# Patient Record
Sex: Female | Born: 1975 | Race: White | Hispanic: No | Marital: Married | Smoking: Never smoker
Health system: Southern US, Community
[De-identification: ages and names within clinical notes are randomized; demographics above are authoritative.]

## PROBLEM LIST (undated history)

## (undated) DIAGNOSIS — E249 Cushing's syndrome, unspecified: Secondary | ICD-10-CM

## (undated) DIAGNOSIS — C439 Malignant melanoma of skin, unspecified: Secondary | ICD-10-CM

## (undated) HISTORY — DX: Malignant melanoma of skin, unspecified: C43.9

## (undated) HISTORY — DX: Cushing's syndrome, unspecified: E24.9

---

## 1996-09-05 HISTORY — PX: ADRENAL GLAND SURGERY: SHX544

## 1999-11-26 ENCOUNTER — Other Ambulatory Visit: Admission: RE | Admit: 1999-11-26 | Discharge: 1999-11-26 | Payer: Self-pay | Admitting: Internal Medicine

## 2001-01-10 ENCOUNTER — Other Ambulatory Visit: Admission: RE | Admit: 2001-01-10 | Discharge: 2001-01-10 | Payer: Self-pay | Admitting: Obstetrics & Gynecology

## 2002-01-25 ENCOUNTER — Other Ambulatory Visit: Admission: RE | Admit: 2002-01-25 | Discharge: 2002-01-25 | Payer: Self-pay | Admitting: Obstetrics & Gynecology

## 2003-02-17 ENCOUNTER — Other Ambulatory Visit: Admission: RE | Admit: 2003-02-17 | Discharge: 2003-02-17 | Payer: Self-pay | Admitting: Obstetrics & Gynecology

## 2004-06-01 ENCOUNTER — Emergency Department (HOSPITAL_COMMUNITY): Admission: EM | Admit: 2004-06-01 | Discharge: 2004-06-02 | Payer: Self-pay | Admitting: Emergency Medicine

## 2006-10-06 ENCOUNTER — Other Ambulatory Visit: Admission: RE | Admit: 2006-10-06 | Discharge: 2006-10-06 | Payer: Self-pay | Admitting: Family Medicine

## 2010-02-17 ENCOUNTER — Other Ambulatory Visit: Admission: RE | Admit: 2010-02-17 | Discharge: 2010-02-17 | Payer: Self-pay | Admitting: Family Medicine

## 2010-09-14 ENCOUNTER — Inpatient Hospital Stay (HOSPITAL_COMMUNITY)
Admission: AD | Admit: 2010-09-14 | Discharge: 2010-09-14 | Payer: Self-pay | Source: Home / Self Care | Attending: Obstetrics and Gynecology | Admitting: Obstetrics and Gynecology

## 2010-10-13 ENCOUNTER — Encounter: Payer: BC Managed Care – PPO | Attending: Obstetrics and Gynecology

## 2010-12-23 ENCOUNTER — Inpatient Hospital Stay (HOSPITAL_COMMUNITY)
Admission: RE | Admit: 2010-12-23 | Discharge: 2010-12-25 | DRG: 372 | Disposition: A | Discharge status: Home / Self Care | Payer: BC Managed Care – PPO | Source: Ambulatory Visit | Attending: Obstetrics and Gynecology | Admitting: Obstetrics and Gynecology

## 2010-12-23 DIAGNOSIS — O99814 Abnormal glucose complicating childbirth: Secondary | ICD-10-CM | POA: Diagnosis present

## 2010-12-23 LAB — CBC
HCT: 38 % (ref 36.0–46.0)
Hemoglobin: 12.9 g/dL (ref 12.0–15.0)
MCH: 30.9 pg (ref 26.0–34.0)
MCHC: 33.9 g/dL (ref 30.0–36.0)
MCV: 90.9 fL (ref 78.0–100.0)
RBC: 4.18 MIL/uL (ref 3.87–5.11)

## 2010-12-23 LAB — GLUCOSE, RANDOM: Glucose, Bld: 126 mg/dL — ABNORMAL HIGH (ref 70–99)

## 2010-12-24 LAB — CBC
HCT: 33.6 % — ABNORMAL LOW (ref 36.0–46.0)
Hemoglobin: 11.1 g/dL — ABNORMAL LOW (ref 12.0–15.0)
MCH: 30.2 pg (ref 26.0–34.0)
MCV: 91.3 fL (ref 78.0–100.0)
Platelets: 108 10*3/uL — ABNORMAL LOW (ref 150–400)
RBC: 3.68 MIL/uL — ABNORMAL LOW (ref 3.87–5.11)
WBC: 13 10*3/uL — ABNORMAL HIGH (ref 4.0–10.5)

## 2011-01-07 NOTE — Discharge Summary (Signed)
Felicia Simon, Simon                ACCOUNT NO.:  [id number]  MEDICAL RECORD NO.:  [id number]           PATIENT TYPE:  I  LOCATION:  9124                          FACILITY:  [facility]  PHYSICIAN:  Malachi Pro. Ambrose Mantle, M.D. DATE OF BIRTH:  21-Oct-1975  DATE OF ADMISSION:  12/23/2010 DATE OF DISCHARGE:  12/24/2010                              DISCHARGE SUMMARY   This 35 year old white female, para 0 gravida 1, EDC December 20, 2010, by last period, presented for induction of labor.  Prenatal care was significant for gestational diabetes, well controlled on diet.  No other issues.  Prenatal labs, O+ with a negative antibody, HIV negative, rubella immune, RPR nonreactive, hepatitis B surface antigen negative, GC and Chlamydia negative.  She declined genetic screening, 1-hour Glucola was 162, three-hour GTT showed diabetes, group B strep was negative.  PAST OBSTETRICAL HISTORY:  Negative.  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  No known allergies.  Prenatal vitamins.  SOCIAL HISTORY:  No ethyl alcohol, tobacco, or drugs.  She is married.  FAMILY HISTORY:  Father with high blood pressure.  Maternal grandmother with ovarian cancer.  Grandfather with Parkinson disease.  PHYSICAL EXAMINATION:  VITAL SIGNS:  On admission, she was afebrile with normal vital signs. HEART:  Normal. LUNGS:  Normal. ABDOMEN:  Soft, gravid. PELVIC:  Cervix was 2-3 cm, 50%, rupture of membranes produced clear fluid.  The patient received an epidural by 1:40 p.m.  She was 5 cm, 70%.  An intrauterine pressure catheter was placed.  She progressed to complete dilatation and a +2 station, was noted to have increasing variable decelerations with Dr. Senaida Ores present.  The patient pushed for approximately 40 minutes and brought the vertex down to the outlet.  At this point, fetal heart rate had  sharp and prolonged decelerations in the 80s, so she and her partner were counseled regarding the risks and agreed to vacuum-assisted delivery.  The Kiwi vacuum was applied to the outlet position LOA and brought to crowning with one easy pull, no pop offs, vacuum remained in the green zone.  The patient then pushed out the remainder of the head and the body of a viable female.  Apgars of 8 and 9 at 1 and 5 minutes, weight 7 pounds 6 ounces.  Placenta was delivered spontaneously, second-degree laceration repaired with 3-0 Vicryl.  Sphincter reinforced with 2-0 but not torn.  Cervix and rectum were intact.  Blood loss about 400 mL.  Postpartum, the patient did well and was discharged on the second postpartum day.  Initial hemoglobin 12.9, hematocrit 38, white count 10,900, platelet count 121,000, RPR nonreactive.  Glucose test was 126.  Followup hemoglobin 11.1, platelet count of 108,000.  FINAL DIAGNOSES:  Intrauterine pregnancy at 40+ weeks, delivered left occipitoanterior (fetal position), terminal fetal heart rate decelerations.  OPERATION:  Vacuum-assisted vaginal delivery, LOA, and second-degree midline laceration and repair.  FINAL CONDITION:  Improved.  INSTRUCTIONS:  Our regular discharge instruction booklet.  The patient is advised to return to the office  in 6 weeks for followup examination. Motrin 600 mg 30 tablets one every 6 hours as needed for pain is given at discharge.  Since the patient had gestational diabetes, she is advised that check for glucose tolerance should be done at 6-12 weeks postpartum.     Malachi Pro. Ambrose Mantle, M.D.     TFH/MEDQ  D:  12/25/2010  T:  12/25/2010  Job:  [id number]  Electronically Signed by Tracey Harries M.D. on 01/07/2011 09:11:58 AM

## 2012-04-27 ENCOUNTER — Other Ambulatory Visit (HOSPITAL_COMMUNITY)
Admission: RE | Admit: 2012-04-27 | Discharge: 2012-04-27 | Disposition: A | Discharge status: Home / Self Care | Payer: BC Managed Care – PPO | Source: Ambulatory Visit | Attending: Family Medicine | Admitting: Family Medicine

## 2012-04-27 ENCOUNTER — Other Ambulatory Visit: Payer: Self-pay | Admitting: Family Medicine

## 2012-04-27 DIAGNOSIS — Z1151 Encounter for screening for human papillomavirus (HPV): Secondary | ICD-10-CM | POA: Insufficient documentation

## 2012-04-27 DIAGNOSIS — Z01419 Encounter for gynecological examination (general) (routine) without abnormal findings: Secondary | ICD-10-CM | POA: Insufficient documentation

## 2012-09-05 NOTE — L&D Delivery Note (Signed)
Delivery Note I was called and told pt had progressed very quickly to complete dilation.  On my arrival she was sitting for an epidural and wanted to proceed with that.  I sttod by why she received her epidural and she did get some immediate relief although she still felt she needed to push.  She pushed well and brought the vertex to a +3 station with baby direct OP.  The FHR was dropping with pushing and did not fully recover between contractions the last several pushes.  The patient was counseled for a vacuum assisted delivery to expedite things and was agreeable.  A kiwi vacuum was applied and in the green zone to +3 station.  The vertex was brought to crowning and pop-off occurred.  The pt was then able to finish pushing out the vertex ini one more contraction.At 11:39 AM a viable female was delivered via Vaginal, Vacuum (Extractor) (Presentation: ; Occiput Posterior).  APGAR:8 , 9; weight pending .   Placenta status: Intact, Spontaneous.  Cord: 3 vessels with the following complications: None.    Anesthesia: Epidural  Episiotomy: None Lacerations: 2nd degree;Perineal Suture Repair: 3.0 vicryl rapide Est. Blood Loss (mL): 300cc  Mom to postpartum.  Baby to Couplet care / Skin to Skin.  Felicia Simon 07/23/2013, 12:02 PM

## 2013-01-08 LAB — OB RESULTS CONSOLE GC/CHLAMYDIA: Chlamydia: NEGATIVE

## 2013-01-08 LAB — OB RESULTS CONSOLE HIV ANTIBODY (ROUTINE TESTING): HIV: NONREACTIVE

## 2013-01-08 LAB — OB RESULTS CONSOLE ABO/RH: RH Type: POSITIVE

## 2013-01-08 LAB — OB RESULTS CONSOLE RPR: RPR: NONREACTIVE

## 2013-01-08 LAB — OB RESULTS CONSOLE HEPATITIS B SURFACE ANTIGEN: Hepatitis B Surface Ag: NEGATIVE

## 2013-07-17 ENCOUNTER — Telehealth (HOSPITAL_COMMUNITY): Payer: Self-pay | Admitting: *Deleted

## 2013-07-17 ENCOUNTER — Encounter (HOSPITAL_COMMUNITY): Payer: Self-pay | Admitting: *Deleted

## 2013-07-17 NOTE — Telephone Encounter (Signed)
Preadmission screen  

## 2013-07-21 NOTE — H&P (Signed)
Felicia Simon is a 37 y.o. female G2P1001 at 39+ weeks (EDD 07/26/13 by 7 week [place])  presenting for OL at term.  Prenatal care complicated by gestational diabetes well-controlled on diet.  No other significant issues.    Maternal Medical History:  Contractions: Frequency: rare.   Perceived severity is mild.    Fetal activity: Perceived fetal activity is normal.    Prenatal Complications - Diabetes: gestational. Diabetes is managed by diet.      Past OB Hx 2012 Vacuum 7#6oz  Past Medical History  Diagnosis Date  . Melanoma     on back  . Cushing's syndrome    Past Surgical History  Procedure Laterality Date  . Adrenal gland surgery  1998   Family History: family history is not on file. Social History:  has no tobacco, alcohol, and drug history on file.   Prenatal Transfer Tool  Maternal Diabetes: Yes:  Diabetes Type:  Diet controlled Genetic Screening: Normal Maternal Ultrasounds/Referrals: Normal Fetal Ultrasounds or other Referrals:  None Maternal Substance Abuse:  No Significant Maternal Medications:  None Significant Maternal Lab Results:  None Other Comments:  None  ROS    There were no vitals taken for this visit. Maternal Exam:  Uterine Assessment: Contraction strength is mild.  Contraction frequency is rare.   Abdomen: Patient reports no abdominal tenderness. Fetal presentation: vertex  Introitus: Normal vulva. Normal vagina.  Pelvis: adequate for delivery.      Physical Exam  Constitutional: She is oriented to person, place, and time. She appears well-developed and well-nourished.  Cardiovascular: Normal rate and regular rhythm.   Respiratory: Effort normal and breath sounds normal.  GI: Soft.  Genitourinary: Vagina normal and uterus normal.  Gravid  Neurological: She is alert and oriented to person, place, and time.  Psychiatric: She has a normal mood and affect.    Prenatal labs: ABO, Rh: O/Positive/-- (05/06 0000) Antibody: Negative (05/06  0000) Rubella: Immune (05/06 0000) RPR: Nonreactive (05/06 0000)  HBsAg: Negative (05/06 0000)  HIV: Non-reactive (05/06 0000)  GBS: Negative (10/24 0000)  Early one hour glucola WNL, 28 week screen abnormal at 144 Quad screen WNL  Assessment/Plan: Pt admitted for IOL at term with favorable cervix.  Plan AROM and pitocin.  BS have been well-controlled on diet.   Oliver Pila 07/21/2013, 9:45 AM

## 2013-07-23 ENCOUNTER — Inpatient Hospital Stay (HOSPITAL_COMMUNITY)
Admission: RE | Admit: 2013-07-23 | Discharge: 2013-07-25 | DRG: 775 | Disposition: A | Discharge status: Home / Self Care | Payer: BC Managed Care – PPO | Source: Ambulatory Visit | Attending: Obstetrics and Gynecology | Admitting: Obstetrics and Gynecology

## 2013-07-23 ENCOUNTER — Encounter (HOSPITAL_COMMUNITY): Payer: Self-pay

## 2013-07-23 ENCOUNTER — Inpatient Hospital Stay (HOSPITAL_COMMUNITY): Payer: BC Managed Care – PPO | Admitting: Anesthesiology

## 2013-07-23 ENCOUNTER — Encounter (HOSPITAL_COMMUNITY): Payer: BC Managed Care – PPO | Admitting: Anesthesiology

## 2013-07-23 DIAGNOSIS — O99814 Abnormal glucose complicating childbirth: Principal | ICD-10-CM | POA: Diagnosis present

## 2013-07-23 LAB — CBC
Hemoglobin: 12.6 g/dL (ref 12.0–15.0)
MCH: 30 pg (ref 26.0–34.0)
Platelets: 124 10*3/uL — ABNORMAL LOW (ref 150–400)
RBC: 4.2 MIL/uL (ref 3.87–5.11)
WBC: 8.3 10*3/uL (ref 4.0–10.5)

## 2013-07-23 LAB — GLUCOSE, CAPILLARY: Glucose-Capillary: 75 mg/dL (ref 70–99)

## 2013-07-23 LAB — RPR: RPR Ser Ql: NONREACTIVE

## 2013-07-23 MED ORDER — DIBUCAINE 1 % RE OINT: 1.0000 "application " | TOPICAL_OINTMENT | RECTAL | Status: DC | PRN

## 2013-07-23 MED ORDER — DIPHENHYDRAMINE HCL 50 MG/ML IJ SOLN: 12.5000 mg | INTRAMUSCULAR | Status: DC | PRN

## 2013-07-23 MED ORDER — SODIUM BICARBONATE 8.4 % IV SOLN
INTRAVENOUS | Status: DC | PRN
  Administered 2013-07-23: 4 mL via EPIDURAL
  Administered 2013-07-23 (×2): 3 mL via EPIDURAL

## 2013-07-23 MED ORDER — PHENYLEPHRINE 40 MCG/ML (10ML) SYRINGE FOR IV PUSH (FOR BLOOD PRESSURE SUPPORT)
80.0000 ug | PREFILLED_SYRINGE | INTRAVENOUS | Status: DC | PRN
  Filled 2013-07-23: qty 2

## 2013-07-23 MED ORDER — LACTATED RINGERS IV SOLN: 500.0000 mL | INTRAVENOUS | Status: DC | PRN

## 2013-07-23 MED ORDER — OXYTOCIN BOLUS FROM INFUSION
500.0000 mL | INTRAVENOUS | Status: DC
  Administered 2013-07-23: 500 mL via INTRAVENOUS

## 2013-07-23 MED ORDER — EPHEDRINE 5 MG/ML INJ
10.0000 mg | INTRAVENOUS | Status: DC | PRN
  Filled 2013-07-23: qty 2
  Filled 2013-07-23: qty 4

## 2013-07-23 MED ORDER — PHENYLEPHRINE 40 MCG/ML (10ML) SYRINGE FOR IV PUSH (FOR BLOOD PRESSURE SUPPORT)
80.0000 ug | PREFILLED_SYRINGE | INTRAVENOUS | Status: DC | PRN
  Filled 2013-07-23: qty 10
  Filled 2013-07-23: qty 2

## 2013-07-23 MED ORDER — LANOLIN HYDROUS EX OINT: TOPICAL_OINTMENT | CUTANEOUS | Status: DC | PRN

## 2013-07-23 MED ORDER — ONDANSETRON HCL 4 MG/2ML IJ SOLN: 4.0000 mg | Freq: Four times a day (QID) | INTRAMUSCULAR | Status: DC | PRN

## 2013-07-23 MED ORDER — OXYCODONE-ACETAMINOPHEN 5-325 MG PO TABS: 1.0000 | ORAL_TABLET | ORAL | Status: DC | PRN

## 2013-07-23 MED ORDER — WITCH HAZEL-GLYCERIN EX PADS: 1.0000 "application " | MEDICATED_PAD | CUTANEOUS | Status: DC | PRN

## 2013-07-23 MED ORDER — TETANUS-DIPHTH-ACELL PERTUSSIS 5-2.5-18.5 LF-MCG/0.5 IM SUSP: 0.5000 mL | Freq: Once | INTRAMUSCULAR | Status: DC

## 2013-07-23 MED ORDER — EPHEDRINE 5 MG/ML INJ
10.0000 mg | INTRAVENOUS | Status: DC | PRN
  Filled 2013-07-23: qty 2

## 2013-07-23 MED ORDER — TERBUTALINE SULFATE 1 MG/ML IJ SOLN: 0.2500 mg | Freq: Once | INTRAMUSCULAR | Status: DC | PRN

## 2013-07-23 MED ORDER — BUTORPHANOL TARTRATE 1 MG/ML IJ SOLN
1.0000 mg | INTRAMUSCULAR | Status: DC | PRN
  Administered 2013-07-23: 1 mg via INTRAVENOUS
  Filled 2013-07-23: qty 1

## 2013-07-23 MED ORDER — IBUPROFEN 600 MG PO TABS
600.0000 mg | ORAL_TABLET | Freq: Four times a day (QID) | ORAL | Status: DC
  Administered 2013-07-23 – 2013-07-25 (×9): 600 mg via ORAL
  Filled 2013-07-23 (×9): qty 1

## 2013-07-23 MED ORDER — ACETAMINOPHEN 325 MG PO TABS: 650.0000 mg | ORAL_TABLET | ORAL | Status: DC | PRN

## 2013-07-23 MED ORDER — FENTANYL CITRATE 0.05 MG/ML IJ SOLN
INTRAMUSCULAR | Status: AC
  Filled 2013-07-23: qty 2

## 2013-07-23 MED ORDER — FENTANYL CITRATE 0.05 MG/ML IJ SOLN: 100.0000 ug | Freq: Once | INTRAMUSCULAR | Status: DC

## 2013-07-23 MED ORDER — IBUPROFEN 600 MG PO TABS
600.0000 mg | ORAL_TABLET | Freq: Four times a day (QID) | ORAL | Status: DC | PRN
  Administered 2013-07-23: 600 mg via ORAL
  Filled 2013-07-23: qty 1

## 2013-07-23 MED ORDER — SENNOSIDES-DOCUSATE SODIUM 8.6-50 MG PO TABS
2.0000 | ORAL_TABLET | ORAL | Status: DC
  Administered 2013-07-23: 2 via ORAL
  Filled 2013-07-23 (×2): qty 2

## 2013-07-23 MED ORDER — OXYTOCIN 40 UNITS IN LACTATED RINGERS INFUSION - SIMPLE MED
62.5000 mL/h | INTRAVENOUS | Status: DC
  Administered 2013-07-23: 62.5 mL/h via INTRAVENOUS
  Filled 2013-07-23: qty 1000

## 2013-07-23 MED ORDER — LACTATED RINGERS IV SOLN
INTRAVENOUS | Status: DC
  Administered 2013-07-23: 08:00:00 via INTRAVENOUS

## 2013-07-23 MED ORDER — FENTANYL CITRATE 0.05 MG/ML IJ SOLN
INTRAMUSCULAR | Status: AC
  Administered 2013-07-23: 100 ug
  Filled 2013-07-23: qty 2

## 2013-07-23 MED ORDER — LACTATED RINGERS IV SOLN: 500.0000 mL | Freq: Once | INTRAVENOUS | Status: DC

## 2013-07-23 MED ORDER — CITRIC ACID-SODIUM CITRATE 334-500 MG/5ML PO SOLN: 30.0000 mL | ORAL | Status: DC | PRN

## 2013-07-23 MED ORDER — SIMETHICONE 80 MG PO CHEW: 80.0000 mg | CHEWABLE_TABLET | ORAL | Status: DC | PRN

## 2013-07-23 MED ORDER — DIPHENHYDRAMINE HCL 25 MG PO CAPS: 25.0000 mg | ORAL_CAPSULE | Freq: Four times a day (QID) | ORAL | Status: DC | PRN

## 2013-07-23 MED ORDER — ONDANSETRON HCL 4 MG/2ML IJ SOLN: 4.0000 mg | INTRAMUSCULAR | Status: DC | PRN

## 2013-07-23 MED ORDER — BENZOCAINE-MENTHOL 20-0.5 % EX AERO
1.0000 "application " | INHALATION_SPRAY | CUTANEOUS | Status: DC | PRN
  Administered 2013-07-23: 1 via TOPICAL
  Filled 2013-07-23: qty 56

## 2013-07-23 MED ORDER — ONDANSETRON HCL 4 MG PO TABS: 4.0000 mg | ORAL_TABLET | ORAL | Status: DC | PRN

## 2013-07-23 MED ORDER — FENTANYL 2.5 MCG/ML BUPIVACAINE 1/10 % EPIDURAL INFUSION (WH - ANES)
14.0000 mL/h | INTRAMUSCULAR | Status: DC | PRN
  Filled 2013-07-23: qty 125

## 2013-07-23 MED ORDER — LIDOCAINE HCL (PF) 1 % IJ SOLN
30.0000 mL | INTRAMUSCULAR | Status: DC | PRN
  Filled 2013-07-23 (×2): qty 30

## 2013-07-23 MED ORDER — PRENATAL MULTIVITAMIN CH
1.0000 | ORAL_TABLET | Freq: Every day | ORAL | Status: DC
  Administered 2013-07-24 – 2013-07-25 (×2): 1 via ORAL
  Filled 2013-07-23 (×2): qty 1

## 2013-07-23 MED ORDER — ZOLPIDEM TARTRATE 5 MG PO TABS: 5.0000 mg | ORAL_TABLET | Freq: Every evening | ORAL | Status: DC | PRN

## 2013-07-23 MED ORDER — OXYTOCIN 40 UNITS IN LACTATED RINGERS INFUSION - SIMPLE MED
1.0000 m[IU]/min | INTRAVENOUS | Status: DC
  Administered 2013-07-23: 2 m[IU]/min via INTRAVENOUS
  Filled 2013-07-23: qty 1000

## 2013-07-23 NOTE — Lactation Note (Signed)
This note was copied from the chart of Felicia Simon. Lactation Consultation Note  Patient Name: Felicia Ressie Slevin [id number] Date: 07/23/2013 Reason for consult: Follow-up assessment Mom trying to sleep, reports baby is BF better and she reports if he will not latch she is doing STS, hand expression and spoon feeding. Blood sugar has been stable with 3 consecutive CBG's greater than 45. Advised Mom to call for assist as needed. LC to follow up with Mom tomorrow.   Maternal Data    Feeding Feeding Type: Breast Fed Length of feed: 8 min  LATCH Score/Interventions Latch: Grasps breast easily, tongue down, lips flanged, rhythmical sucking. Intervention(s): Skin to skin;Teach feeding cues Intervention(s): Adjust position;Assist with latch;Breast compression  Audible Swallowing: A few with stimulation Intervention(s): Skin to skin;Hand expression  Type of Nipple: Everted at rest and after stimulation Intervention(s): No intervention needed  Comfort (Breast/Nipple): Soft / non-tender     Hold (Positioning): Assistance needed to correctly position infant at breast and maintain latch. Intervention(s): Breastfeeding basics reviewed;Position options;Support Pillows  LATCH Score: 8  Lactation Tools Discussed/Used     Consult Status Consult Status: Follow-up Date: 07/24/13 Follow-up type: In-patient    Alfred Levins 07/23/2013, 9:29 PM

## 2013-07-23 NOTE — Anesthesia Preprocedure Evaluation (Signed)
Anesthesia Evaluation  Patient identified by MRN, date of birth, ID band Patient awake    Reviewed: Allergy & Precautions, H&P , NPO status , Patient's Chart, lab work & pertinent test results  Airway Mallampati: II TM Distance: >3 FB Neck ROM: full    Dental  (+) Teeth Intact   Pulmonary neg pulmonary ROS,  breath sounds clear to auscultation  Pulmonary exam normal       Cardiovascular negative cardio ROS  Rhythm:regular Rate:Normal     Neuro/Psych negative neurological ROS  negative psych ROS   GI/Hepatic negative GI ROS, Neg liver ROS,   Endo/Other  Cushing syndrome  Renal/GU negative Renal ROS     Musculoskeletal   Abdominal Normal abdominal exam  (+)   Peds  Hematology   Anesthesia Other Findings   Reproductive/Obstetrics (+) Pregnancy                           Anesthesia Physical Anesthesia Plan  ASA: II  Anesthesia Plan: Epidural   Post-op Pain Management:    Induction:   Airway Management Planned:   Additional Equipment:   Intra-op Plan:   Post-operative Plan:   Informed Consent: I have reviewed the patients History and Physical, chart, labs and discussed the procedure including the risks, benefits and alternatives for the proposed anesthesia with the patient or authorized representative who has indicated his/her understanding and acceptance.     Plan Discussed with: Anesthesiologist  Anesthesia Plan Comments:         Anesthesia Quick Evaluation

## 2013-07-23 NOTE — Anesthesia Procedure Notes (Signed)
Epidural Patient location during procedure: OB  Staffing Anesthesiologist: Heather Roberts Performed by: anesthesiologist   Preanesthetic Checklist Completed: patient identified, site marked, surgical consent, pre-op evaluation, timeout performed, IV checked, risks and benefits discussed, monitors and equipment checked and post-op pain management  Epidural Patient position: sitting Prep: DuraPrep Patient monitoring: heart rate, cardiac monitor, continuous pulse ox and blood pressure Approach: midline Injection technique: LOR air  Needle:  Needle type: Tuohy  Needle gauge: 17 G Needle length: 9 cm Needle insertion depth: 7 cm Catheter type: closed end flexible Catheter size: 19 Gauge Catheter at skin depth: 12 cm Test dose: negative and 2% lidocaine with Epi 1:200 K  Assessment Events: blood not aspirated, injection not painful and no injection resistance  Additional Notes Reason for block:post-op pain management

## 2013-07-23 NOTE — Progress Notes (Signed)
Patient ID: Felicia Simon, female   DOB: 06-15-1976, 37 y.o.   MRN: [id number] Pt not feeling ctx yet afeb vss FHR category 1 70/3/-2 AROM clear  On pitocin, will follow progress. Epidural prn.

## 2013-07-23 NOTE — Progress Notes (Signed)
Assisting with patient care at this time. IVF bolus for epidural in progress. Anesth Dr. Krista Blue notified of pt request for epidural. Support and encouragement given.

## 2013-07-24 LAB — CBC
HCT: 32.7 % — ABNORMAL LOW (ref 36.0–46.0)
Hemoglobin: 11.4 g/dL — ABNORMAL LOW (ref 12.0–15.0)
MCHC: 34.9 g/dL (ref 30.0–36.0)
MCV: 87.9 fL (ref 78.0–100.0)
Platelets: 112 10*3/uL — ABNORMAL LOW (ref 150–400)
RDW: 13.4 % (ref 11.5–15.5)
WBC: 10.6 10*3/uL — ABNORMAL HIGH (ref 4.0–10.5)

## 2013-07-24 NOTE — Progress Notes (Signed)
Post Partum Day 1 Subjective: no complaints and tolerating PO  Objective: Blood pressure 116/79, pulse 78, temperature 97.7 F (36.5 C), temperature source Oral, resp. rate 18, height 5\' 6"  (1.676 m), weight 84.369 kg (186 lb), SpO2 100.00%, unknown if currently breastfeeding.  Physical Exam:  General: alert and cooperative Lochia: appropriate Uterine Fundus: firm    Recent Labs  07/23/13 0720 07/24/13 0550  HGB 12.6 11.4*  HCT 36.6 32.7*    Assessment/Plan: Plan for discharge tomorrow   LOS: 1 day   Demetra Moya W 07/24/2013, 9:09 AM

## 2013-07-24 NOTE — Discharge Summary (Signed)
Obstetric Discharge Summary Reason for Admission: induction of labor Prenatal Procedures: none Intrapartum Procedures: vacuum Postpartum Procedures: none Complications-Operative and Postpartum: second degree perineal laceration Hemoglobin  Date Value Range Status  07/24/2013 11.4* 12.0 - 15.0 g/dL Final     HCT  Date Value Range Status  07/24/2013 32.7* 36.0 - 46.0 % Final    Physical Exam:  General: alert and cooperative Lochia: appropriate Uterine Fundus: firm   Discharge Diagnoses: Term Pregnancy-delivered  Discharge Information: Date: 07/25/2013 Activity: pelvic rest Diet: routine Medications: Ibuprofen Condition: improved Instructions: refer to practice specific booklet Discharge to: home Follow-up Information   Follow up with Oliver Pila, MD. Schedule an appointment as soon as possible for a visit in 6 weeks. (postpartum)    Specialty:  Obstetrics and Gynecology   Contact information:   [address], [address] [place] [address] [phone]       Newborn Data: Live born female  Birth Weight: 7 lb 4.8 oz (3310 g) APGAR: 8, 9  Home with mother.  Oliver Pila 07/25/2013, 7:41 AM

## 2013-07-25 MED ORDER — IBUPROFEN 600 MG PO TABS: 600.0000 mg | ORAL_TABLET | Freq: Four times a day (QID) | ORAL | Status: DC

## 2013-07-25 NOTE — Lactation Note (Signed)
This note was copied from the chart of Felicia Simon. Lactation Consultation Note  Patient Name: Felicia Railynn Ballo [id number] Date: 07/25/2013 Reason for consult: Follow-up assessment;Difficult latch Mom called for LC to observe latch. Assisted Mom with obtaining more depth with the latch using the nipple shield. Breast milk present when baby at the breast and in the nipple shield at the end of the feeding. Encouraged Mom to keep baby actively nursing for 15-30 minutes on 1st breast, then offer other breast if baby still hungry. Mom has DEBP at home and will post pump during the day to encourage milk production. Will call if decides to schedule OP follow up.   Maternal Data    Feeding Feeding Type: Breast Fed Length of feed: 15 min  LATCH Score/Interventions Latch: Grasps breast easily, tongue down, lips flanged, rhythmical sucking. (using nipple shield #20) Intervention(s): Adjust position  Audible Swallowing: Spontaneous and intermittent  Type of Nipple: Everted at rest and after stimulation  Comfort (Breast/Nipple): Soft / non-tender     Hold (Positioning): Assistance needed to correctly position infant at breast and maintain latch. Intervention(s): Breastfeeding basics reviewed;Support Pillows;Position options;Skin to skin  LATCH Score: 9  Lactation Tools Discussed/Used Tools: Nipple Dorris Carnes;Pump Nipple shield size: 20 Breast pump type: Manual   Consult Status Consult Status: Complete Date: 07/25/13 Follow-up type: In-patient    Alfred Levins 07/25/2013, 9:50 AM

## 2013-07-25 NOTE — Progress Notes (Signed)
Post Partum Day 2 Subjective: no complaints and up ad lib  Objective: Blood pressure 115/74, pulse 73, temperature 98.3 F (36.8 C), temperature source Oral, resp. rate 19, height 5\' 6"  (1.676 m), weight 84.369 kg (186 lb), SpO2 99.00%, unknown if currently breastfeeding.  Physical Exam:  General: alert and cooperative Lochia: appropriate Uterine Fundus: firm    Recent Labs  07/23/13 0720 07/24/13 0550  HGB 12.6 11.4*  HCT 36.6 32.7*    Assessment/Plan: Discharge home Motrin   LOS: 2 days   Margarite Vessel W 07/25/2013, 6:11 AM

## 2013-07-25 NOTE — Lactation Note (Signed)
This note was copied from the chart of Felicia Chaka Boyson. Lactation Consultation Note  Patient Name: Felicia Simon [id number] Date: 07/25/2013  Mom reports baby is nursing well with nipple shield. She reports observing colostrum in the nipple shield at the end of the feeding. Mom reports her nipple pain has improved. Engorgement care reviewed if needed. Advised Mom baby should be at the breast 8-12 times in 24 hours or more. Monitor voids/stools. Encouraged to schedule OP follow up due to nipple shield use. Mom advised she will call. Encouraged Mom to call for LC to observe latch before d/c.   Maternal Data    Feeding Feeding Type: Breast Fed Length of feed: 15 min  LATCH Score/Interventions                      Lactation Tools Discussed/Used     Consult Status      Alfred Levins 07/25/2013, 8:58 AM

## 2013-07-30 NOTE — Anesthesia Postprocedure Evaluation (Signed)
  Anesthesia Post-op Note  Patient: Felicia Simon  Procedure(s) Performed: Epidural  Patient Location: PACU and Mother/Baby  Anesthesia Type:Epidural  Level of Consciousness: awake  Airway and Oxygen Therapy: Patient Spontanous Breathing  Post-op Pain: mild  Post-op Assessment: Post-op Vital signs reviewed  Post-op Vital Signs: Reviewed  Complications: No apparent anesthesia complications Completed via chart review only

## 2013-07-30 NOTE — Addendum Note (Signed)
Addendum created 07/30/13 0810 by Heather Roberts, MD   Modules edited: Notes Section   Notes Section:  File: [id number]

## 2013-08-26 ENCOUNTER — Other Ambulatory Visit: Payer: Self-pay | Admitting: Dermatology

## 2014-07-07 ENCOUNTER — Encounter (HOSPITAL_COMMUNITY): Payer: Self-pay

## 2018-09-05 HISTORY — PX: LIPOMA EXCISION: SHX5283

## 2021-01-19 ENCOUNTER — Other Ambulatory Visit: Payer: Self-pay | Admitting: Obstetrics and Gynecology

## 2021-01-19 DIAGNOSIS — R928 Other abnormal and inconclusive findings on diagnostic imaging of breast: Secondary | ICD-10-CM

## 2021-02-15 ENCOUNTER — Other Ambulatory Visit: Payer: Self-pay

## 2021-03-03 ENCOUNTER — Ambulatory Visit
Admission: RE | Admit: 2021-03-03 | Discharge: 2021-03-03 | Disposition: A | Discharge status: Home / Self Care | Payer: BC Managed Care – PPO | Source: Ambulatory Visit | Attending: Obstetrics and Gynecology | Admitting: Obstetrics and Gynecology

## 2021-03-03 ENCOUNTER — Other Ambulatory Visit: Payer: Self-pay | Admitting: Obstetrics and Gynecology

## 2021-03-03 ENCOUNTER — Other Ambulatory Visit: Payer: Self-pay

## 2021-03-03 DIAGNOSIS — R928 Other abnormal and inconclusive findings on diagnostic imaging of breast: Secondary | ICD-10-CM

## 2021-03-03 DIAGNOSIS — N6489 Other specified disorders of breast: Secondary | ICD-10-CM

## 2021-03-03 DIAGNOSIS — N6310 Unspecified lump in the right breast, unspecified quadrant: Secondary | ICD-10-CM

## 2021-03-16 ENCOUNTER — Ambulatory Visit
Admission: RE | Admit: 2021-03-16 | Discharge: 2021-03-16 | Disposition: A | Discharge status: Home / Self Care | Payer: BC Managed Care – PPO | Source: Ambulatory Visit | Attending: Obstetrics and Gynecology | Admitting: Obstetrics and Gynecology

## 2021-03-16 ENCOUNTER — Other Ambulatory Visit: Payer: Self-pay

## 2021-03-16 DIAGNOSIS — N6310 Unspecified lump in the right breast, unspecified quadrant: Secondary | ICD-10-CM

## 2021-03-16 DIAGNOSIS — N6489 Other specified disorders of breast: Secondary | ICD-10-CM

## 2021-05-14 ENCOUNTER — Ambulatory Visit: Payer: Self-pay | Admitting: Surgery

## 2021-05-14 DIAGNOSIS — N6489 Other specified disorders of breast: Secondary | ICD-10-CM

## 2021-05-20 ENCOUNTER — Other Ambulatory Visit: Payer: Self-pay | Admitting: Surgery

## 2021-05-20 DIAGNOSIS — Z1231 Encounter for screening mammogram for malignant neoplasm of breast: Secondary | ICD-10-CM

## 2021-06-08 NOTE — Pre-Procedure Instructions (Signed)
Surgical Instructions    Your procedure is scheduled on Thursday 06/17/21.   Report to [facility] Main Entrance "A" at 05:30 A.M., then check in with the Admitting office.  Call this number if you have problems the morning of surgery:  [phone]   If you have any questions prior to your surgery date call [phone]: Open Monday-Friday 8am-4pm    Remember:  Do not eat after midnight the night before your surgery  You may drink clear liquids until 04:30 A.M. the morning of your surgery.   Clear liquids allowed are: Water, Non-Citrus Juices (without pulp), Carbonated Beverages, Clear Tea, Black Coffee ONLY (NO MILK, CREAM OR POWDERED CREAMER of any kind), and Gatorade    Take these medicines the morning of surgery with A SIP OF WATER   NONE   As of today, STOP taking any Aspirin (unless otherwise instructed by your surgeon) Aleve, Naproxen, Ibuprofen, Motrin, Advil, Goody's, BC's, all herbal medications, fish oil, and all vitamins.          Do not wear jewelry or makeup Do not wear lotions, powders, perfumes/colognes, or deodorant. Do not shave 48 hours prior to surgery.  Men may shave face and neck. Do not bring valuables to the hospital. DO Not wear nail polish, gel polish, artificial nails, or any other type of covering on natural nails including finger and toenails. If patients have artificial nails, gel coating, etc. that need to be removed by a nail salon please have this removed prior to surgery or surgery may need to be canceled/delayed if the surgeon/ anesthesia feels like the patient is unable to be adequately monitored.             [facility] is not responsible for any belongings or valuables.  Do NOT Smoke (Tobacco/Vaping)  24 hours prior to your procedure If you use a CPAP at night, you may bring your mask for your overnight stay.   Contacts, glasses, dentures or bridgework may not be worn into surgery, please bring cases for these belongings   For patients  admitted to the hospital, discharge time will be determined by your treatment team.   Patients discharged the day of surgery will not be allowed to drive home, and someone needs to stay with them for 24 hours.  NO VISITORS WILL BE ALLOWED IN PRE-OP WHERE PATIENTS ARE PREPPED FOR SURGERY.  ONLY 1 SUPPORT PERSON MAY BE PRESENT IN THE WAITING ROOM WHILE YOU ARE IN SURGERY.  IF YOU ARE TO BE ADMITTED, ONCE YOU ARE IN YOUR ROOM YOU WILL BE ALLOWED TWO (2) VISITORS. 1 (ONE) VISITOR MAY STAY OVERNIGHT BUT MUST ARRIVE TO THE ROOM BY 8pm.  Minor children may have two parents present. Special consideration for safety and communication needs will be reviewed on a case by case basis.  Special instructions:    Oral Hygiene is also important to reduce your risk of infection.  Remember - BRUSH YOUR TEETH THE MORNING OF SURGERY WITH YOUR REGULAR TOOTHPASTE   [facility]- Preparing For Surgery  Before surgery, you can play an important role. Because skin is not sterile, your skin needs to be as free of germs as possible. You can reduce the number of germs on your skin by washing with CHG (chlorahexidine gluconate) Soap before surgery.  CHG is an antiseptic cleaner which kills germs and bonds with the skin to continue killing germs even after washing.     Please do not use if you have an allergy to CHG or  antibacterial soaps. If your skin becomes reddened/irritated stop using the CHG.  Do not shave (including legs and underarms) for at least 48 hours prior to first CHG shower. It is OK to shave your face.  Please follow these instructions carefully.     Shower the NIGHT BEFORE SURGERY and the MORNING OF SURGERY with CHG Soap.   If you chose to wash your hair, wash your hair first as usual with your normal shampoo. After you shampoo, rinse your hair and body thoroughly to remove the shampoo.  Then [employer] and genitals (private parts) with your normal soap and rinse thoroughly to remove soap.  After that Use  CHG Soap as you would any other liquid soap. You can apply CHG directly to the skin and wash gently with a scrungie or a clean washcloth.   Apply the CHG Soap to your body ONLY FROM THE NECK DOWN.  Do not use on open wounds or open sores. Avoid contact with your eyes, ears, mouth and genitals (private parts). Wash Face and genitals (private parts)  with your normal soap.   Wash thoroughly, paying special attention to the area where your surgery will be performed.  Thoroughly rinse your body with warm water from the neck down.  DO NOT shower/wash with your normal soap after using and rinsing off the CHG Soap.  Pat yourself dry with a CLEAN TOWEL.  Wear CLEAN PAJAMAS to bed the night before surgery  Place CLEAN SHEETS on your bed the night before your surgery  DO NOT SLEEP WITH PETS.   Day of Surgery:  Take a shower with CHG soap. Wear Clean/Comfortable clothing the morning of surgery Do not apply any deodorants/lotions.   Remember to brush your teeth WITH YOUR REGULAR TOOTHPASTE.   Please read over the following fact sheets that you were given.

## 2021-06-09 ENCOUNTER — Other Ambulatory Visit: Payer: Self-pay

## 2021-06-09 ENCOUNTER — Encounter (HOSPITAL_COMMUNITY)
Admission: RE | Admit: 2021-06-09 | Discharge: 2021-06-09 | Disposition: A | Discharge status: Home / Self Care | Payer: BC Managed Care – PPO | Source: Ambulatory Visit | Attending: Surgery | Admitting: Surgery

## 2021-06-09 ENCOUNTER — Encounter (HOSPITAL_COMMUNITY): Payer: Self-pay

## 2021-06-09 DIAGNOSIS — Z01812 Encounter for preprocedural laboratory examination: Secondary | ICD-10-CM | POA: Diagnosis present

## 2021-06-09 DIAGNOSIS — N6489 Other specified disorders of breast: Secondary | ICD-10-CM | POA: Diagnosis not present

## 2021-06-09 LAB — COMPREHENSIVE METABOLIC PANEL
ALT: 19 U/L (ref 0–44)
AST: 20 U/L (ref 15–41)
Albumin: 3.8 g/dL (ref 3.5–5.0)
Alkaline Phosphatase: 49 U/L (ref 38–126)
Anion gap: 6 (ref 5–15)
BUN: 8 mg/dL (ref 6–20)
CO2: 27 mmol/L (ref 22–32)
Calcium: 8.9 mg/dL (ref 8.9–10.3)
Chloride: 101 mmol/L (ref 98–111)
Creatinine, Ser: 0.69 mg/dL (ref 0.44–1.00)
GFR, Estimated: >60 mL/min (ref >60)
Glucose, Bld: 100 mg/dL — ABNORMAL HIGH (ref 70–99)
Potassium: 3.7 mmol/L (ref 3.5–5.1)
Sodium: 134 mmol/L — ABNORMAL LOW (ref 135–145)
Total Bilirubin: 1 mg/dL (ref 0.3–1.2)
Total Protein: 7.1 g/dL (ref 6.5–8.1)

## 2021-06-09 LAB — CBC WITH DIFFERENTIAL/PLATELET
Abs Immature Granulocytes: 0.02 10*3/uL (ref 0.00–0.07)
Basophils Absolute: 0.1 10*3/uL (ref 0.0–0.1)
Basophils Relative: 1 %
Eosinophils Absolute: 0.1 10*3/uL (ref 0.0–0.5)
Eosinophils Relative: 1 %
HCT: 40.8 % (ref 36.0–46.0)
Hemoglobin: 13 g/dL (ref 12.0–15.0)
Immature Granulocytes: 0 %
Lymphocytes Relative: 32 %
Lymphs Abs: 2.1 10*3/uL (ref 0.7–4.0)
MCH: 27.9 pg (ref 26.0–34.0)
MCHC: 31.9 g/dL (ref 30.0–36.0)
MCV: 87.6 fL (ref 80.0–100.0)
Monocytes Absolute: 0.5 10*3/uL (ref 0.1–1.0)
Monocytes Relative: 8 %
Neutro Abs: 3.9 10*3/uL (ref 1.7–7.7)
Neutrophils Relative %: 58 %
Platelets: 299 10*3/uL (ref 150–400)
RBC: 4.66 MIL/uL (ref 3.87–5.11)
RDW: 12.7 % (ref 11.5–15.5)
WBC: 6.7 10*3/uL (ref 4.0–10.5)
nRBC: 0 % (ref 0.0–0.2)

## 2021-06-09 LAB — POCT PREGNANCY, URINE: Preg Test, Ur: NEGATIVE

## 2021-06-09 NOTE — Progress Notes (Signed)
PCP - LindsayWebster PA with [facility] in [facility]   Chest x-ray - Not indicated EKG - Not indicated  ERAS Protcol - Yes  Anesthesia review: No  Patient denies shortness of breath, fever, cough and chest pain at PAT appointment   All instructions explained to the patient, with a verbal understanding of the material. Patient agrees to go over the instructions while at home for a better understanding.  The opportunity to ask questions was provided.

## 2021-06-16 ENCOUNTER — Ambulatory Visit
Admission: RE | Admit: 2021-06-16 | Discharge: 2021-06-16 | Disposition: A | Discharge status: Home / Self Care | Payer: BC Managed Care – PPO | Source: Ambulatory Visit | Attending: Surgery | Admitting: Surgery

## 2021-06-16 ENCOUNTER — Other Ambulatory Visit: Payer: Self-pay

## 2021-06-16 DIAGNOSIS — N6489 Other specified disorders of breast: Secondary | ICD-10-CM

## 2021-06-16 NOTE — Anesthesia Preprocedure Evaluation (Addendum)
Anesthesia Evaluation  Patient identified by MRN, date of birth, ID band Patient awake    Reviewed: Allergy & Precautions, NPO status , Patient's Chart, lab work & pertinent test results  Airway Mallampati: II  TM Distance: >3 FB Neck ROM: Full    Dental no notable dental hx. (+) Teeth Intact, Dental Advisory Given   Pulmonary neg pulmonary ROS,    Pulmonary exam normal breath sounds clear to auscultation       Cardiovascular negative cardio ROS Normal cardiovascular exam Rhythm:Regular Rate:Normal     Neuro/Psych negative neurological ROS  negative psych ROS   GI/Hepatic negative GI ROS, Neg liver ROS,   Endo/Other  negative endocrine ROSCushing's syndrome s/p adrenal resection  Renal/GU negative Renal ROS  negative genitourinary   Musculoskeletal negative musculoskeletal ROS (+)   Abdominal   Peds  Hematology negative hematology ROS (+)   Anesthesia Other Findings   Reproductive/Obstetrics                            Anesthesia Physical Anesthesia Plan  ASA: 2  Anesthesia Plan: General   Post-op Pain Management:    Induction: Intravenous  PONV Risk Score and Plan: 3 and Ondansetron, Dexamethasone and Midazolam  Airway Management Planned: LMA  Additional Equipment:   Intra-op Plan:   Post-operative Plan: Extubation in OR  Informed Consent: I have reviewed the patients History and Physical, chart, labs and discussed the procedure including the risks, benefits and alternatives for the proposed anesthesia with the patient or authorized representative who has indicated his/her understanding and acceptance.     Dental advisory given  Plan Discussed with: CRNA  Anesthesia Plan Comments:         Anesthesia Quick Evaluation

## 2021-06-17 ENCOUNTER — Ambulatory Visit (HOSPITAL_COMMUNITY): Payer: BC Managed Care – PPO | Admitting: Anesthesiology

## 2021-06-17 ENCOUNTER — Encounter (HOSPITAL_COMMUNITY)
Admission: RE | Disposition: A | Discharge status: Home / Self Care | Payer: Self-pay | Source: Ambulatory Visit | Attending: Surgery

## 2021-06-17 ENCOUNTER — Ambulatory Visit
Admission: RE | Admit: 2021-06-17 | Discharge: 2021-06-17 | Disposition: A | Discharge status: Home / Self Care | Payer: BC Managed Care – PPO | Source: Ambulatory Visit | Attending: Surgery | Admitting: Surgery

## 2021-06-17 ENCOUNTER — Other Ambulatory Visit: Payer: Self-pay

## 2021-06-17 ENCOUNTER — Ambulatory Visit (HOSPITAL_COMMUNITY)
Admission: RE | Admit: 2021-06-17 | Discharge: 2021-06-17 | Disposition: A | Discharge status: Home / Self Care | Payer: BC Managed Care – PPO | Source: Ambulatory Visit | Attending: Surgery | Admitting: Surgery

## 2021-06-17 ENCOUNTER — Encounter (HOSPITAL_COMMUNITY): Payer: Self-pay | Admitting: Surgery

## 2021-06-17 DIAGNOSIS — N6489 Other specified disorders of breast: Secondary | ICD-10-CM | POA: Diagnosis present

## 2021-06-17 DIAGNOSIS — N62 Hypertrophy of breast: Secondary | ICD-10-CM | POA: Insufficient documentation

## 2021-06-17 DIAGNOSIS — Z1231 Encounter for screening mammogram for malignant neoplasm of breast: Secondary | ICD-10-CM

## 2021-06-17 HISTORY — PX: BREAST LUMPECTOMY WITH RADIOACTIVE SEED LOCALIZATION: SHX6424

## 2021-06-17 SURGERY — BREAST LUMPECTOMY WITH RADIOACTIVE SEED LOCALIZATION
Anesthesia: General | Site: Breast | Laterality: Right

## 2021-06-17 MED ORDER — PROPOFOL 10 MG/ML IV BOLUS
INTRAVENOUS | Status: DC | PRN
  Administered 2021-06-17: 200 mg via INTRAVENOUS

## 2021-06-17 MED ORDER — BUPIVACAINE-EPINEPHRINE 0.25% -1:200000 IJ SOLN
INTRAMUSCULAR | Status: DC | PRN
  Administered 2021-06-17: 20 mL

## 2021-06-17 MED ORDER — CHLORHEXIDINE GLUCONATE 0.12 % MT SOLN
15.0000 mL | Freq: Once | OROMUCOSAL | Status: AC
  Administered 2021-06-17: 15 mL via OROMUCOSAL
  Filled 2021-06-17: qty 15

## 2021-06-17 MED ORDER — PROPOFOL 10 MG/ML IV BOLUS
INTRAVENOUS | Status: AC
  Filled 2021-06-17: qty 20

## 2021-06-17 MED ORDER — ACETAMINOPHEN 500 MG PO TABS
1000.0000 mg | ORAL_TABLET | Freq: Once | ORAL | Status: AC
  Administered 2021-06-17: 1000 mg via ORAL
  Filled 2021-06-17: qty 2

## 2021-06-17 MED ORDER — CHLORHEXIDINE GLUCONATE CLOTH 2 % EX PADS: 6.0000 | MEDICATED_PAD | Freq: Once | CUTANEOUS | Status: DC

## 2021-06-17 MED ORDER — LIDOCAINE HCL (CARDIAC) PF 100 MG/5ML IV SOSY
PREFILLED_SYRINGE | INTRAVENOUS | Status: DC | PRN
  Administered 2021-06-17: 40 mg via INTRAVENOUS

## 2021-06-17 MED ORDER — ONDANSETRON HCL 4 MG/2ML IJ SOLN
INTRAMUSCULAR | Status: DC | PRN
  Administered 2021-06-17: 4 mg via INTRAVENOUS

## 2021-06-17 MED ORDER — MIDAZOLAM HCL 5 MG/5ML IJ SOLN
INTRAMUSCULAR | Status: DC | PRN
  Administered 2021-06-17: 2 mg via INTRAVENOUS

## 2021-06-17 MED ORDER — DEXAMETHASONE SODIUM PHOSPHATE 10 MG/ML IJ SOLN
INTRAMUSCULAR | Status: DC | PRN
  Administered 2021-06-17: 8 mg via INTRAVENOUS

## 2021-06-17 MED ORDER — CEFAZOLIN SODIUM-DEXTROSE 2-4 GM/100ML-% IV SOLN
2.0000 g | INTRAVENOUS | Status: AC
  Administered 2021-06-17: 2 g via INTRAVENOUS
  Filled 2021-06-17: qty 100

## 2021-06-17 MED ORDER — HYDROCODONE-ACETAMINOPHEN 5-325 MG PO TABS: 1.0000 | ORAL_TABLET | Freq: Four times a day (QID) | ORAL | 0 refills | Status: AC | PRN

## 2021-06-17 MED ORDER — LIDOCAINE 2% (20 MG/ML) 5 ML SYRINGE
INTRAMUSCULAR | Status: AC
  Filled 2021-06-17: qty 5

## 2021-06-17 MED ORDER — ORAL CARE MOUTH RINSE: 15.0000 mL | Freq: Once | OROMUCOSAL | Status: AC

## 2021-06-17 MED ORDER — LACTATED RINGERS IV SOLN: INTRAVENOUS | Status: DC

## 2021-06-17 MED ORDER — ONDANSETRON HCL 4 MG/2ML IJ SOLN
INTRAMUSCULAR | Status: AC
  Filled 2021-06-17: qty 2

## 2021-06-17 MED ORDER — IBUPROFEN 800 MG PO TABS: 800.0000 mg | ORAL_TABLET | Freq: Three times a day (TID) | ORAL | 0 refills | Status: AC | PRN

## 2021-06-17 MED ORDER — MIDAZOLAM HCL 2 MG/2ML IJ SOLN
INTRAMUSCULAR | Status: AC
  Filled 2021-06-17: qty 2

## 2021-06-17 MED ORDER — EPHEDRINE SULFATE 50 MG/ML IJ SOLN
INTRAMUSCULAR | Status: DC | PRN
Start: 2021-06-17 — End: 2021-06-17
  Administered 2021-06-17 (×3): 5 mg via INTRAVENOUS

## 2021-06-17 MED ORDER — BUPIVACAINE-EPINEPHRINE (PF) 0.25% -1:200000 IJ SOLN
INTRAMUSCULAR | Status: AC
  Filled 2021-06-17: qty 30

## 2021-06-17 MED ORDER — FENTANYL CITRATE (PF) 250 MCG/5ML IJ SOLN
INTRAMUSCULAR | Status: AC
  Filled 2021-06-17: qty 5

## 2021-06-17 MED ORDER — 0.9 % SODIUM CHLORIDE (POUR BTL) OPTIME
TOPICAL | Status: DC | PRN
  Administered 2021-06-17: 1000 mL

## 2021-06-17 MED ORDER — FENTANYL CITRATE (PF) 100 MCG/2ML IJ SOLN: 25.0000 ug | INTRAMUSCULAR | Status: DC | PRN

## 2021-06-17 MED ORDER — DEXAMETHASONE SODIUM PHOSPHATE 10 MG/ML IJ SOLN
INTRAMUSCULAR | Status: AC
  Filled 2021-06-17: qty 1

## 2021-06-17 MED ORDER — FENTANYL CITRATE (PF) 100 MCG/2ML IJ SOLN
INTRAMUSCULAR | Status: DC | PRN
  Administered 2021-06-17: 100 ug via INTRAVENOUS

## 2021-06-17 MED ORDER — EPHEDRINE 5 MG/ML INJ
INTRAVENOUS | Status: AC
  Filled 2021-06-17: qty 5

## 2021-06-17 SURGICAL SUPPLY — 35 items
BAG COUNTER SPONGE SURGICOUNT (BAG) ×2 IMPLANT
BINDER BREAST XLRG (GAUZE/BANDAGES/DRESSINGS) ×2 IMPLANT
CANISTER SUCT 3000ML PPV (MISCELLANEOUS) ×2 IMPLANT
CHLORAPREP W/TINT 26 (MISCELLANEOUS) ×2 IMPLANT
COVER PROBE W GEL 5X96 (DRAPES) ×2 IMPLANT
COVER SURGICAL LIGHT HANDLE (MISCELLANEOUS) ×2 IMPLANT
DERMABOND ADVANCED (GAUZE/BANDAGES/DRESSINGS) ×1
DERMABOND ADVANCED .7 DNX12 (GAUZE/BANDAGES/DRESSINGS) ×1 IMPLANT
DEVICE DUBIN SPECIMEN MAMMOGRA (MISCELLANEOUS) ×2 IMPLANT
DRAPE CHEST BREAST 15X10 FENES (DRAPES) ×2 IMPLANT
ELECT CAUTERY BLADE 6.4 (BLADE) ×2 IMPLANT
ELECT REM PT RETURN 9FT ADLT (ELECTROSURGICAL) ×2
ELECTRODE REM PT RTRN 9FT ADLT (ELECTROSURGICAL) ×1 IMPLANT
GLOVE SRG 8 PF TXTR STRL LF DI (GLOVE) ×1 IMPLANT
GLOVE SURG ENC MOIS LTX SZ8 (GLOVE) ×2 IMPLANT
GLOVE SURG UNDER POLY LF SZ8 (GLOVE) ×2
GOWN STRL REUS W/ TWL LRG LVL3 (GOWN DISPOSABLE) ×1 IMPLANT
GOWN STRL REUS W/ TWL XL LVL3 (GOWN DISPOSABLE) ×1 IMPLANT
GOWN STRL REUS W/TWL LRG LVL3 (GOWN DISPOSABLE) ×2
GOWN STRL REUS W/TWL XL LVL3 (GOWN DISPOSABLE) ×2
KIT BASIN OR (CUSTOM PROCEDURE TRAY) ×2 IMPLANT
KIT MARKER MARGIN INK (KITS) ×2 IMPLANT
LIGHT WAVEGUIDE WIDE FLAT (MISCELLANEOUS) IMPLANT
NEEDLE HYPO 25GX1X1/2 BEV (NEEDLE) ×2 IMPLANT
NS IRRIG 1000ML POUR BTL (IV SOLUTION) ×2 IMPLANT
PACK GENERAL/GYN (CUSTOM PROCEDURE TRAY) ×2 IMPLANT
SUT MNCRL AB 4-0 PS2 18 (SUTURE) ×2 IMPLANT
SUT SILK 2 0 SH (SUTURE) IMPLANT
SUT VIC AB 2-0 SH 27 (SUTURE) ×2
SUT VIC AB 2-0 SH 27XBRD (SUTURE) ×1 IMPLANT
SUT VIC AB 3-0 SH 27 (SUTURE) ×2
SUT VIC AB 3-0 SH 27X BRD (SUTURE) ×1 IMPLANT
SUT VIC AB 3-0 SH 8-18 (SUTURE) ×2 IMPLANT
SYR CONTROL 10ML LL (SYRINGE) ×2 IMPLANT
TOWEL GREEN STERILE FF (TOWEL DISPOSABLE) ×2 IMPLANT

## 2021-06-17 NOTE — Anesthesia Postprocedure Evaluation (Signed)
Anesthesia Post Note  Patient: Felicia Simon  Procedure(s) Performed: RIGHT BREAST LUMPECTOMY WITH RADIOACTIVE SEED LOCALIZATION (Right: Breast)     Patient location during evaluation: PACU Anesthesia Type: General Level of consciousness: awake and alert Pain management: pain level controlled Vital Signs Assessment: post-procedure vital signs reviewed and stable Respiratory status: spontaneous breathing, nonlabored ventilation, respiratory function stable and patient connected to nasal cannula oxygen Cardiovascular status: blood pressure returned to baseline and stable Postop Assessment: no apparent nausea or vomiting Anesthetic complications: no   No notable events documented.  Last Vitals:  Vitals:   06/17/21 0841 06/17/21 0848  BP: 124/76 122/78  Pulse: 81 75  Resp: 15 14  Temp:  36.6 C  SpO2: 100% 100%    Last Pain:  Vitals:   06/17/21 0848  TempSrc:   PainSc: 0-No pain                 Nathin Saran L Casten Floren

## 2021-06-17 NOTE — Anesthesia Procedure Notes (Signed)
Procedure Name: LMA Insertion Date/Time: 06/17/2021 7:40 AM Performed by: Jonna Munro, CRNA Pre-anesthesia Checklist: Patient identified, Emergency Drugs available, Suction available, Patient being monitored and Timeout performed Patient Re-evaluated:Patient Re-evaluated prior to induction Oxygen Delivery Method: Circle system utilized Preoxygenation: Pre-oxygenation with 100% oxygen Induction Type: IV induction LMA: LMA inserted LMA Size: 4.0 Number of attempts: 1 Placement Confirmation: positive ETCO2 and breath sounds checked- equal and bilateral Tube secured with: Tape Dental Injury: Teeth and Oropharynx as per pre-operative assessment

## 2021-06-17 NOTE — OR Nursing (Signed)
Called breast center at [phone], spoke to radiologist Dr. Maury Dus, confirmed clip placement at [facility] and seed placement at [facility].

## 2021-06-17 NOTE — H&P (Signed)
History of Present Illness: Felicia Simon is a 45 y.o. female who is seen today as an office consultation at the request of Dr. Marvel Plan for evaluation of abnormal screening mammogram. Patient noted to have an area of abnormality in the right breast upper outer quadrant. There are 2 spots are biopsied 1 was benign the other was consistent with a radial scar right breast. No atypia noted. No family history of breast cancer and no previous breast biopsy noted by the patient. Patient complains of some soreness and bruising at the site..   Review of Systems: A complete review of systems was obtained from the patient. I have reviewed this information and discussed as appropriate with the patient. See HPI as well for other ROS.    Medical History: History reviewed. No pertinent past medical history.  There is no problem list on file for this patient.  Past Surgical History:  Procedure Laterality Date   lipoma removal    No Known Allergies  No current outpatient medications on file prior to visit.   No current facility-administered medications on file prior to visit.   History reviewed. No pertinent family history.   Social History   Tobacco Use  Smoking Status Never Smoker  Smokeless Tobacco Never Used    Social History   Socioeconomic History   Marital status: Married  Tobacco Use   Smoking status: Never Smoker   Smokeless tobacco: Never Used   Objective:   Vitals:  05/14/21 0937  BP: 132/84  Pulse: 97  SpO2: 97%  Weight: 81.3 kg (179 lb 3.2 oz)  Height: 167.6 cm (5\' 6" )   Body mass index is 28.92 kg/m.  Physical Exam Constitutional:  Appearance: Normal appearance.  HENT:  Head: Normocephalic.  Nose: Nose normal.  Eyes:  Extraocular Movements: Extraocular movements intact.  Cardiovascular:  Rate and Rhythm: Normal rate.  Pulmonary:  Effort: Pulmonary effort is normal.  Breath sounds: No stridor.  Chest:  Breasts:  Right: Normal. No swelling or mass.   Left: Normal. No swelling or mass.   Musculoskeletal:  General: Normal range of motion.  Cervical back: Normal range of motion.  Neurological:  Mental Status: She is alert.     Labs, Imaging and Diagnostic Testing: Diagnosis 1. Breast, right, needle core biopsy, 9 o'clock 4 cm Ribbon Clip - COMPLEX SCLEROSING LESION WITH USUAL DUCTAL HYPERPLASIA - SEE COMMENT 2. Breast, right, needle core biopsy, 10 o'clock 7 cm coil clip - COLUMNAR CELL AND FIBROCYSTIC CHANGES - NO MALIGNANCY IDENTIFIED Microscopic Comment 1. The core biopsies have an overall lobular architecture with small infiltrative-appearing ducts, focal retraction and some single cells scattered in sclerotic stroma. Immunohistochemistry for cytokeratin 5/6, SMM, calponin and p63 is performed. These ductal proliferation (positive for cytokeratin 5/6) maintains a myoepithelial layer (positive for SMM, calponin and p63), supporting the above diagnosis.  Patient returns today to evaluate a possible RIGHT breast distortion questioned on recent screening mammogram.   EXAM: DIGITAL DIAGNOSTIC UNILATERAL RIGHT MAMMOGRAM WITH TOMOSYNTHESIS AND CAD; ULTRASOUND RIGHT BREAST LIMITED   TECHNIQUE: Right digital diagnostic mammography and breast tomosynthesis was performed. The images were evaluated with computer-aided detection.; Targeted ultrasound examination of the right breast was performed   COMPARISON: Previous exams including recent screening mammogram dated 01/12/2021   ACR Breast Density Category b: There are scattered areas of fibroglandular density.   FINDINGS: RIGHT breast diagnostic mammogram: On today's additional diagnostic views, there is a persistent architectural distortion within the upper RIGHT breast, best seen on true lateral slice 62.  Targeted ultrasound is performed, showing a hypoechoic area with associated architectural distortion in the RIGHT breast at the 9 o'clock axis, 4 cm from the nipple,  with internal vascularity, measuring 1.3 cm greatest dimension, with an adjacent cyst, corresponding to the mammographic findings.   There is an additional hypoechoic area in the RIGHT breast at the 10 o'clock axis, 7 cm from the nipple, likely corresponding as an incidental finding, suspected fibrocystic change.   RIGHT axilla was evaluated with ultrasound showing no enlarged or morphologically abnormal lymph nodes.   IMPRESSION: 1. Suspicious hypoechoic area with associated architectural distortion in the RIGHT breast at the 9 o'clock axis, 4 cm from the nipple, measuring 1.3 cm, with an adjacent cyst, corresponding to the mammographic findings. Ultrasound-guided biopsy is recommended. 2. Additional hypoechoic area in the RIGHT breast at the 10 o'clock axis, 7 cm from the nipple, likely corresponding as incidental finding, suspected incidental fibrocystic change separate from the area of architectural distortion seen on mammogram. Ultrasound-guided biopsy is recommended to ensure benignity.   RECOMMENDATION: 1. Ultrasound-guided biopsy of the suspicious hypoechoic area with associated architectural distortion in the RIGHT breast at the 9 o'clock axis, 4 cm from the nipple, measuring 1.3 cm, corresponding to the mammographic findings. 2. Ultrasound-guided biopsy of the additional hypoechoic area in the RIGHT breast at the 10 o'clock axis, 7 cm from the nipple, likely corresponding as incidental finding, suspected fibrocystic change.   Ultrasound-guided biopsies are scheduled July 12th.    Assessment and Plan:  Diagnoses and all orders for this visit:  Radial scar of breast    Discussed the pros and cons of right breast seed lumpectomy. Discussed potential upgrade risk of up to 5% or higher depending on findings of a radial scar. Discussed high risk state may be discovered as well. She is opted for right breast seed localized lumpectomy. Risk bleeding, infection, cosmetic  deformity, pain, swelling, need for additional surgery, anesthesia risk, cardiovascular events, the need for other treatments and procedures discussed.  No follow-ups on file.  Kennieth Francois, MD   I spent a total of 36 minutes in both face-to-face and non-face-to-face activities for this visit on the date of this encounter.

## 2021-06-17 NOTE — Transfer of Care (Signed)
Immediate Anesthesia Transfer of Care Note  Patient: Felicia Simon  Procedure(s) Performed: RIGHT BREAST LUMPECTOMY WITH RADIOACTIVE SEED LOCALIZATION (Right: Breast)  Patient Location: PACU  Anesthesia Type:General  Level of Consciousness: awake, patient cooperative and responds to stimulation  Airway & Oxygen Therapy: Patient Spontanous Breathing  Post-op Assessment: Report given to RN  Post vital signs: Reviewed and stable  Last Vitals:  Vitals Value Taken Time  BP 114/71 06/17/21 0826  Temp    Pulse 86 06/17/21 0828  Resp 17 06/17/21 0828  SpO2 100 % 06/17/21 0828  Vitals shown include unvalidated device data.  Last Pain:  Vitals:   06/17/21 0559  TempSrc:   PainSc: 0-No pain         Complications: No notable events documented.

## 2021-06-17 NOTE — Addendum Note (Signed)
Addendum  created 06/17/21 0954 by Jonna Munro, CRNA   Flowsheet accepted

## 2021-06-17 NOTE — Op Note (Signed)
Preoperative diagnosis: Right breast radial scar  Postoperative diagnosis: Same  Procedure: Right breast seed localized lumpectomy  Surgeon: Erroll Luna, MD  Anesthesia: LMA with 0.25% Marcaine plain  EBL: Minimal  Specimen right breast tissue with seed and clip verified by Faxitron  Drains: None  IV fluids: Per anesthesia record  Indications for procedure: The patient presents for right breast lumpectomy due to radial scar diagnosed by mammogram and core biopsy.  Risk, benefits and rationale for excision were discussed.  Nonoperative options also discussed.The procedure has been discussed with the patient. Alternatives to surgery have been discussed with the patient.  Risks of surgery include bleeding,  Infection,  Seroma formation, death,  and the need for further surgery.   The patient understands and wishes to proceed.       Description of procedure: Patient was met in the holding area and questions were answered.  Neoprobe used to verify seed location right breast .  Procedure reviewed all questions answered again.  Complications reviewed.  Patient then taken back to operating.  She is placed supine upon the OR table.  After induction of LMA anesthesia, breast was prepped and draped in sterile fashion and timeout performed.  Proper patient, site and procedure verified.  Neoprobe identified seed in the breast.  Curvilinear incision made in the right lateral breast and dissection carried down into the subcutaneous tissues.  All tissue around  the seed and  clip were excised with a grossly negative margin.  Hemostasis achieved with cautery.  Faxitron revealed seed and clip to be in specimen.  This was oriented with ink and sent to pathology.  Wound irrigated and local anesthetic infiltrated.  Hemostasis achieved wound closed with 3-0 Vicryl for Monocryl.  Dermabond applied.  All counts were found to be correct.  Breast binder placed.  The patient was awoke extubated taken to recovery in  satisfactory condition.

## 2021-06-17 NOTE — Discharge Instructions (Signed)
[facility] Office Phone Number [phone]  BREAST BIOPSY/ PARTIAL MASTECTOMY: POST OP INSTRUCTIONS  Always review your discharge instruction sheet given to you by the facility where your surgery was performed.  IF YOU HAVE DISABILITY OR FAMILY LEAVE FORMS, YOU MUST BRING THEM TO THE OFFICE FOR PROCESSING.  DO NOT GIVE THEM TO YOUR DOCTOR.  A prescription for pain medication may be given to you upon discharge.  Take your pain medication as prescribed, if needed.  If narcotic pain medicine is not needed, then you may take acetaminophen (Tylenol) or ibuprofen (Advil) as needed. Take your usually prescribed medications unless otherwise directed If you need a refill on your pain medication, please contact your pharmacy.  They will contact our office to request authorization.  Prescriptions will not be filled after 5pm or on week-ends. You should eat very light the first 24 hours after surgery, such as soup, crackers, pudding, etc.  Resume your normal diet the day after surgery. Most patients will experience some swelling and bruising in the breast.  Ice packs and a good support bra will help.  Swelling and bruising can take several days to resolve.  It is common to experience some constipation if taking pain medication after surgery.  Increasing fluid intake and taking a stool softener will usually help or prevent this problem from occurring.  A mild laxative (Milk of Magnesia or Miralax) should be taken according to package directions if there are no bowel movements after 48 hours. Unless discharge instructions indicate otherwise, you may remove your bandages 24-48 hours after surgery, and you may shower at that time.  You may have steri-strips (small skin tapes) in place directly over the incision.  These strips should be left on the skin for 7-10 days.  If your surgeon used skin glue on the incision, you may shower in 24 hours.  The glue will flake off over the next 2-3 weeks.  Any  sutures or staples will be removed at the office during your follow-up visit. ACTIVITIES:  You may resume regular daily activities (gradually increasing) beginning the next day.  Wearing a good support bra or sports bra minimizes pain and swelling.  You may have sexual intercourse when it is comfortable. You may drive when you no longer are taking prescription pain medication, you can comfortably wear a seatbelt, and you can safely maneuver your car and apply brakes. RETURN TO WORK:  ______________________________________________________________________________________ You should see your doctor in the office for a follow-up appointment approximately two weeks after your surgery.  Your doctor's nurse will typically make your follow-up appointment when she calls you with your pathology report.  Expect your pathology report 2-3 business days after your surgery.  You may call to check if you do not hear from us after three days. OTHER INSTRUCTIONS: _______________________________________________________________________________________________ _____________________________________________________________________________________________________________________________________ _____________________________________________________________________________________________________________________________________ _____________________________________________________________________________________________________________________________________  WHEN TO CALL YOUR DOCTOR: Fever over 101.0 Nausea and/or vomiting. Extreme swelling or bruising. Continued bleeding from incision. Increased pain, redness, or drainage from the incision.  The clinic staff is available to answer your questions during regular business hours.  Please don't hesitate to call and ask to speak to one of the nurses for clinical concerns.  If you have a medical emergency, go to the nearest emergency room or call 911.  A surgeon from [facility] is always on call at the hospital.  For further questions, please visit [email or web address]   

## 2021-06-17 NOTE — Interval H&P Note (Signed)
History and Physical Interval Note:  06/17/2021 7:25 AM  Felicia Simon  has presented today for surgery, with the diagnosis of RIGHT BREAST RADIAL SCAR.  The various methods of treatment have been discussed with the patient and family. After consideration of risks, benefits and other options for treatment, the patient has consented to  Procedure(s): RIGHT BREAST LUMPECTOMY WITH RADIOACTIVE SEED LOCALIZATION (Right) as a surgical intervention.  The patient's history has been reviewed, patient examined, no change in status, stable for surgery.  I have reviewed the patient's chart and labs.  Questions were answered to the patient's satisfaction.     [place]

## 2021-06-18 ENCOUNTER — Encounter (HOSPITAL_COMMUNITY): Payer: Self-pay | Admitting: Surgery

## 2021-06-21 LAB — SURGICAL PATHOLOGY

## 2021-08-28 IMAGING — US US  BREAST BX W/ LOC DEV 1ST LESION IMG BX SPEC US GUIDE*R*
1 series · 12 of 25 positions shown · non-contrast
Comparison: Previous exam(s).
COMPARISON: Previous exam(s).

Addendum:
CLINICAL DATA: Patient presents for ultrasound-guided core biopsy
of 2 sites in the RIGHT breast.

EXAM:
ULTRASOUND GUIDED RIGHT BREAST CORE NEEDLE BIOPSY x2

[Series 1: us breast bx w/ loc dev 1st lesion img bx spec us  · 0.06mm/px · 12 of 25 slices shown]
[im 2/25]
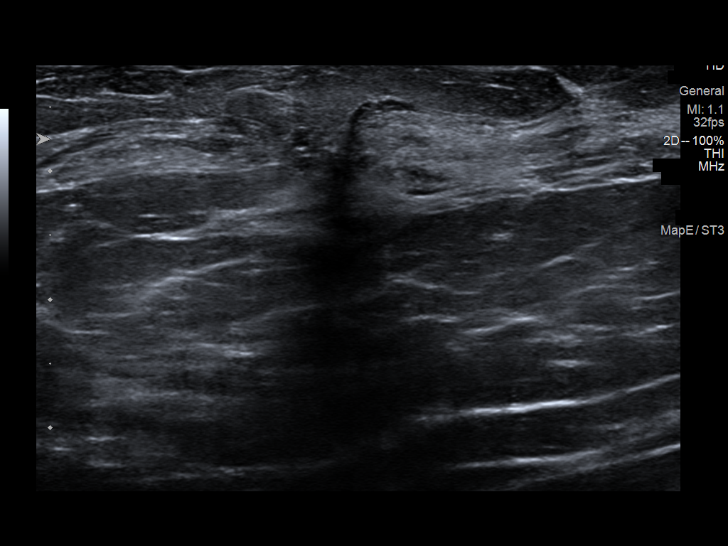
[im 4/25]
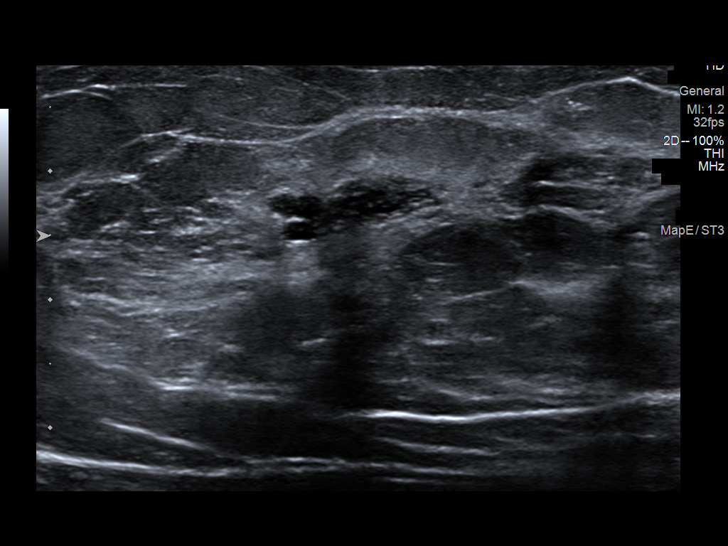
[im 6/25]
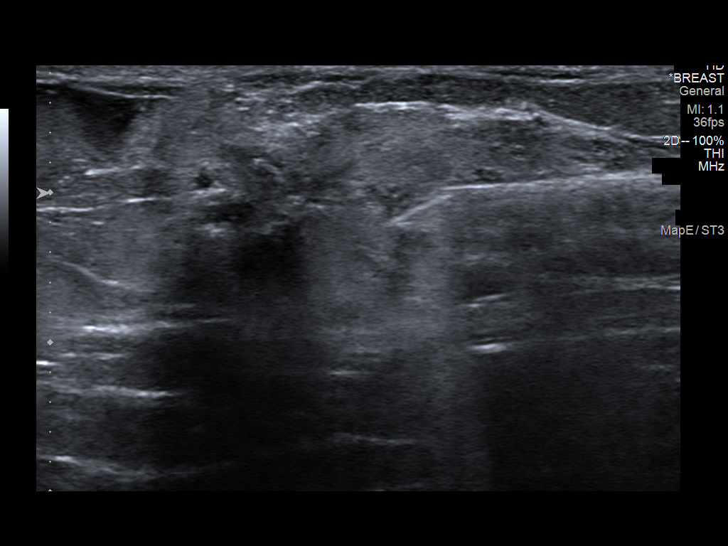
[im 8/25]
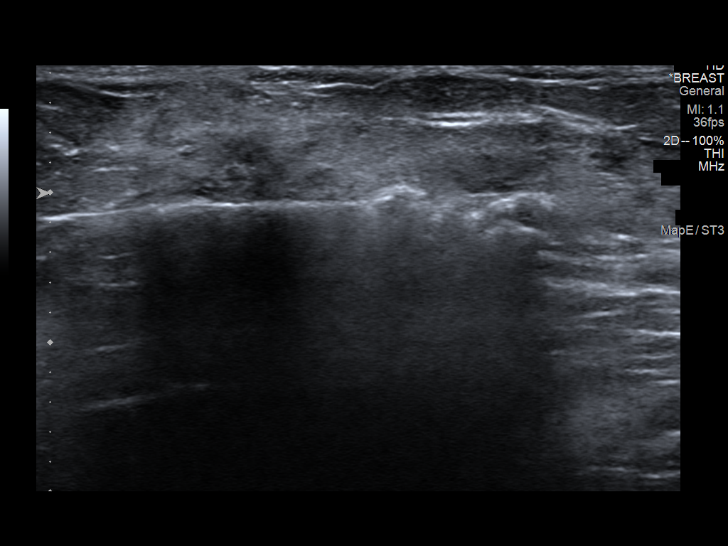
[im 10/25]
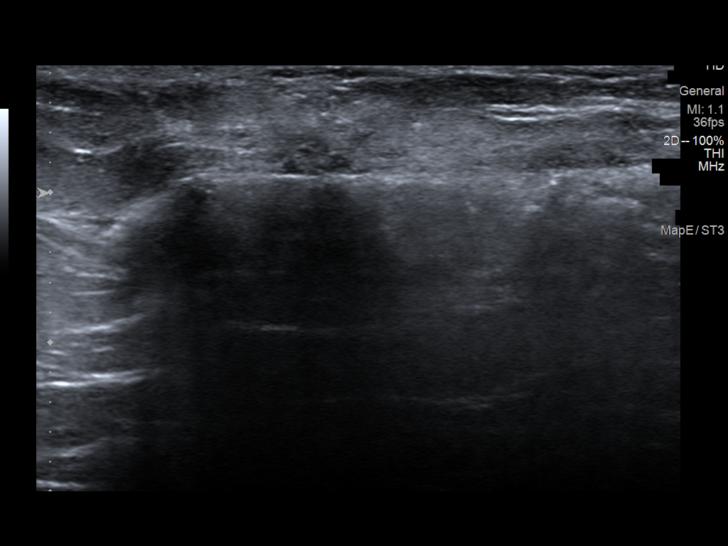
[im 12/25]
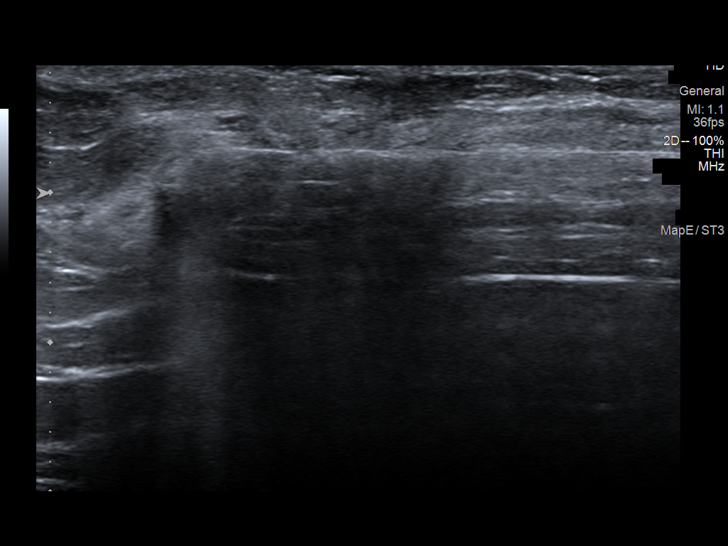
[im 14/25]
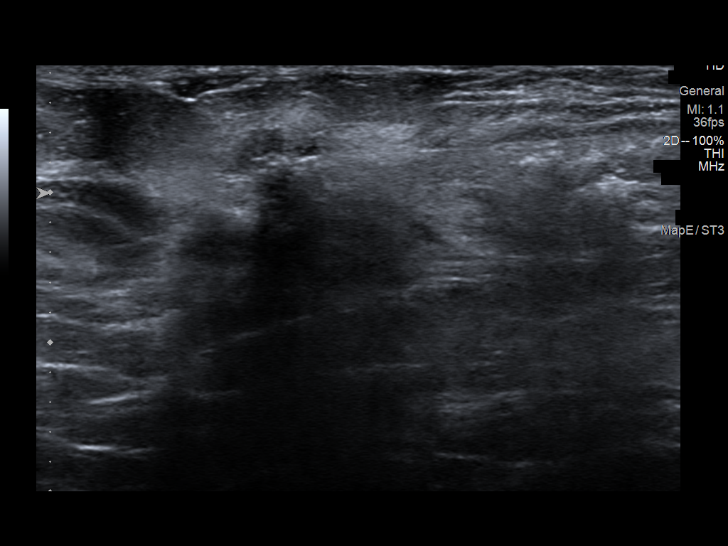
[im 16/25]
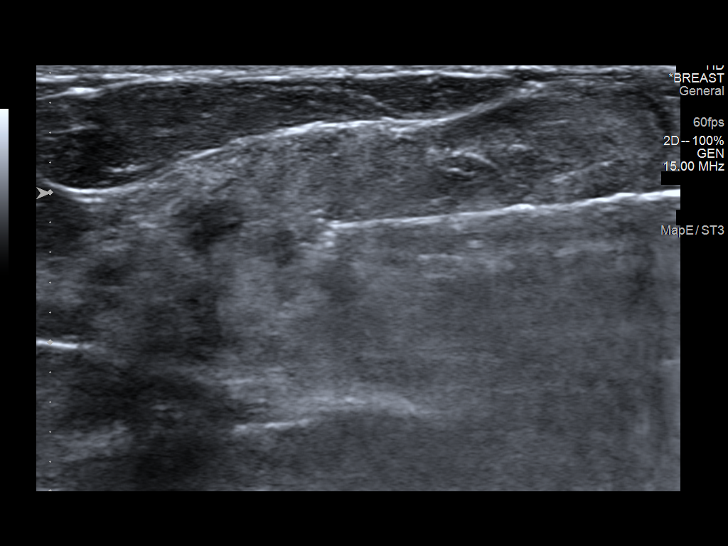
[im 18/25]
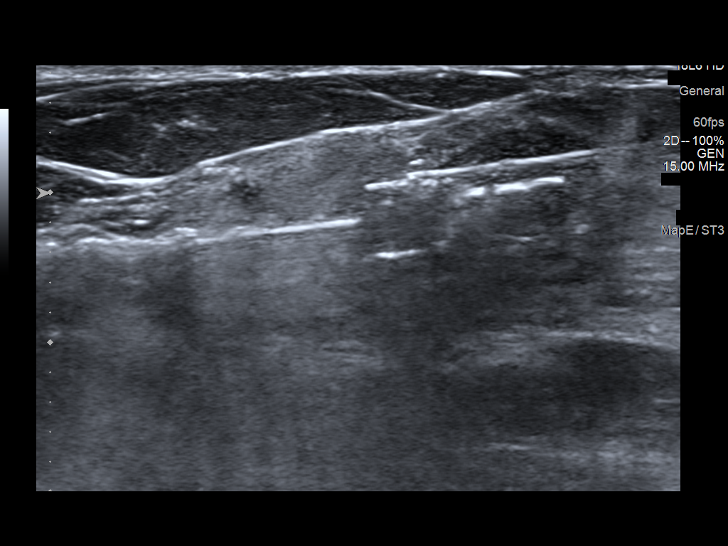
[im 20/25]
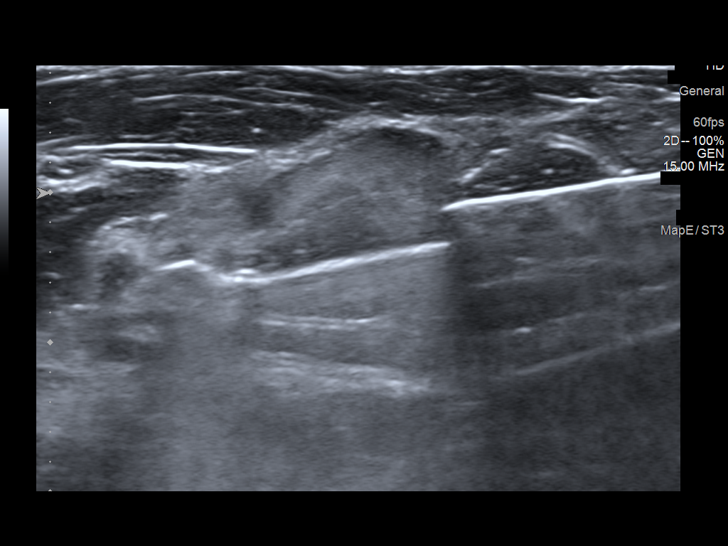
[im 22/25]
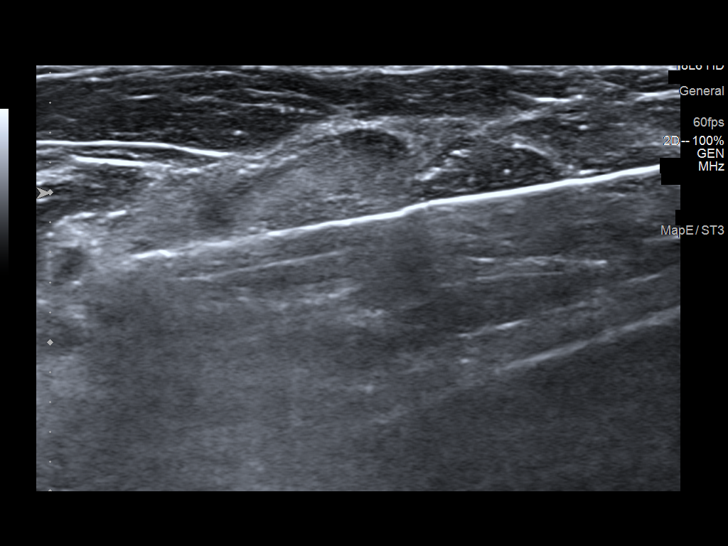
[im 24/25]
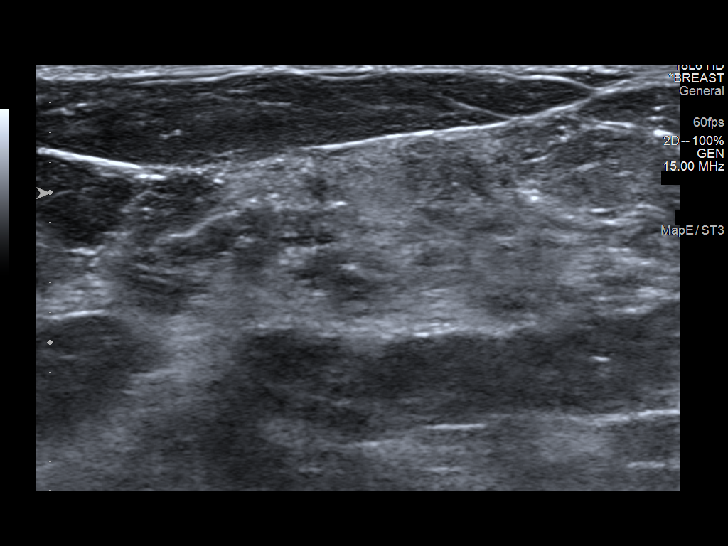

[12 of 25 positions shown; findings below may reference images not displayed]



Site 1: 9 o'clock location of the RIGHT breast, 4 centimeters from
the nipple. Lesion quadrant: LATERAL RIGHT breast, ribbon clip

Using sterile technique and 1% lidocaine as local anesthetic, under
direct ultrasound visualization, a 12 gauge Akramuzzaman device was
used to perform biopsy of distortion in the 9 o'clock location of
the RIGHT breast using a inferior to superior approach. At the
conclusion of the procedure ribbon shaped tissue marker clip was
deployed into the biopsy cavity.

Site 2: 10 o'clock location of the RIGHT breast, 7 centimeters from
nipple. Lesion quadrant: UPPER OUTER, coil clip

Using sterile technique and 1% lidocaine as local anesthetic, under
direct ultrasound visualization, a 12 gauge Akramuzzaman device was
used to perform biopsy of mass in the 10 o'clock location of the
RIGHT breast 7 centimeters from the nipple using a inferior to
superior approach. At the conclusion of the procedure coil shaped
tissue marker clip was deployed into the biopsy cavity.

Follow-up 2-view mammogram was performed and dictated separately.
IMPRESSION: Ultrasound guided biopsy of 2 lesions in the RIGHT breast. No
apparent complications.

ADDENDUM:
Pathology revealed COMPLEX SCLEROSING LESION WITH USUAL DUCTAL
HYPERPLASIA of the RIGHT breast, 9 o'clock, 1cmfn, ribbon clip. This
was found to be concordant by Dr. Alta Omari, with surgical
consultation for excision recommended.

Pathology revealed COLUMNAR CELL AND FIBROCYSTIC CHANGES of the
RIGHT breast, 10 o'clock, 1cmfn, coil clip. This was found to be
concordant by Dr. Alta Omari.

Pathology results were discussed with the patient by telephone. The
patient reported doing well after the biopsies with tenderness at
the sites. Post biopsy instructions and care were reviewed and
questions were answered. The patient was encouraged to call The

Surgical consultation has been arranged with Dr. Swazelihle Ahrends at
[[email or web address]] on April 21, 2021.

Pathology results reported by Jhonny Alfredo Roblox RN on 03/18/2021.



Site 1: 9 o'clock location of the RIGHT breast, 4 centimeters from
the nipple. Lesion quadrant: LATERAL RIGHT breast, ribbon clip

Using sterile technique and 1% lidocaine as local anesthetic, under
direct ultrasound visualization, a 12 gauge Akramuzzaman device was
used to perform biopsy of distortion in the 9 o'clock location of
the RIGHT breast using a inferior to superior approach. At the
conclusion of the procedure ribbon shaped tissue marker clip was
deployed into the biopsy cavity.

Site 2: 10 o'clock location of the RIGHT breast, 7 centimeters from
nipple. Lesion quadrant: UPPER OUTER, coil clip

Using sterile technique and 1% lidocaine as local anesthetic, under
direct ultrasound visualization, a 12 gauge Akramuzzaman device was
used to perform biopsy of mass in the 10 o'clock location of the
RIGHT breast 7 centimeters from the nipple using a inferior to
superior approach. At the conclusion of the procedure coil shaped
tissue marker clip was deployed into the biopsy cavity.

Follow-up 2-view mammogram was performed and dictated separately.
IMPRESSION: Ultrasound guided biopsy of 2 lesions in the RIGHT breast. No
apparent complications.

## 2021-10-28 ENCOUNTER — Encounter (HOSPITAL_COMMUNITY): Payer: Self-pay
